# Patient Record
Sex: Female | Born: 1975
Health system: Southern US, Community
[De-identification: ages and names within clinical notes are randomized; demographics above are authoritative.]

## PROBLEM LIST (undated history)

## (undated) DIAGNOSIS — Z1231 Encounter for screening mammogram for malignant neoplasm of breast: Secondary | ICD-10-CM

## (undated) DIAGNOSIS — E559 Vitamin D deficiency, unspecified: Secondary | ICD-10-CM

## (undated) DIAGNOSIS — O26892 Other specified pregnancy related conditions, second trimester: Secondary | ICD-10-CM

## (undated) DIAGNOSIS — IMO0002 Reserved for concepts with insufficient information to code with codable children: Secondary | ICD-10-CM

## (undated) DIAGNOSIS — E079 Disorder of thyroid, unspecified: Secondary | ICD-10-CM

## (undated) DIAGNOSIS — N979 Female infertility, unspecified: Secondary | ICD-10-CM

## (undated) DIAGNOSIS — R87619 Unspecified abnormal cytological findings in specimens from cervix uteri: Secondary | ICD-10-CM

## (undated) DIAGNOSIS — E119 Type 2 diabetes mellitus without complications: Secondary | ICD-10-CM

## (undated) DIAGNOSIS — N200 Calculus of kidney: Secondary | ICD-10-CM

## (undated) DIAGNOSIS — N883 Incompetence of cervix uteri: Secondary | ICD-10-CM

## (undated) HISTORY — DX: Type 2 diabetes mellitus without complications: E11.9

## (undated) HISTORY — PX: WISDOM TOOTH EXTRACTION: SHX21

## (undated) HISTORY — DX: Calculus of kidney: N20.0

## (undated) HISTORY — DX: Unspecified abnormal cytological findings in specimens from cervix uteri: R87.619

## (undated) HISTORY — DX: Disorder of thyroid, unspecified: E07.9

## (undated) HISTORY — DX: Female infertility, unspecified: N97.9

## (undated) HISTORY — DX: Incompetence of cervix uteri: N88.3

## (undated) HISTORY — DX: Reserved for concepts with insufficient information to code with codable children: IMO0002

## (undated) HISTORY — PX: OTHER SURGICAL HISTORY: SHX169

---

## 2004-07-26 ENCOUNTER — Encounter: Admission: RE | Admit: 2004-07-26 | Discharge: 2004-07-26 | Payer: Self-pay | Admitting: Family Medicine

## 2004-10-16 ENCOUNTER — Other Ambulatory Visit: Admission: RE | Admit: 2004-10-16 | Discharge: 2004-10-16 | Payer: Self-pay | Admitting: Obstetrics and Gynecology

## 2005-06-12 ENCOUNTER — Emergency Department (HOSPITAL_COMMUNITY): Admission: EM | Admit: 2005-06-12 | Discharge: 2005-06-12 | Payer: Self-pay | Admitting: Emergency Medicine

## 2008-06-19 ENCOUNTER — Inpatient Hospital Stay (HOSPITAL_COMMUNITY): Admission: AD | Admit: 2008-06-19 | Discharge: 2008-06-19 | Payer: Self-pay | Admitting: Obstetrics & Gynecology

## 2008-09-13 ENCOUNTER — Encounter: Admission: RE | Admit: 2008-09-13 | Discharge: 2008-09-13 | Payer: Self-pay | Admitting: Emergency Medicine

## 2008-11-23 ENCOUNTER — Ambulatory Visit (HOSPITAL_COMMUNITY): Admission: RE | Admit: 2008-11-23 | Discharge: 2008-11-23 | Payer: Self-pay | Admitting: Specialist

## 2009-11-21 IMAGING — RF DG HYSTEROGRAM
7 series · 7 of 7 positions shown · IV contrast (omnipaque)
Comparison: none

CLINICAL DATA: Evaluate tubal patency. History of repeated
miscarriage.

HYSTEROSALPINGOGRAM
TECHNIQUE: Following cleansing of the cervix and vagina with
Betadine solution, a hysterosalpingogram was performed using a 5-
French hysterosalpingogram catheter and Omnipaque 300 contrast. The
patient tolerated the examination without difficulty.
Fluoroscopy time: 1.0 minutes.

[Series 1: run · 1 of 1 slices shown (1 of 7)]
[im 1/1]
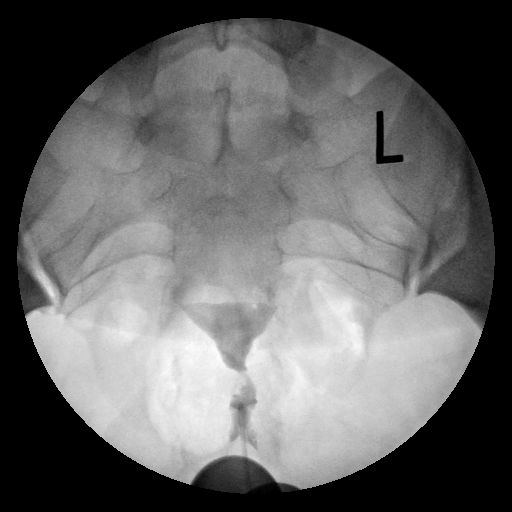

[Series 2: run · 1 of 1 slices shown (2 of 7)]
[im 1/1]
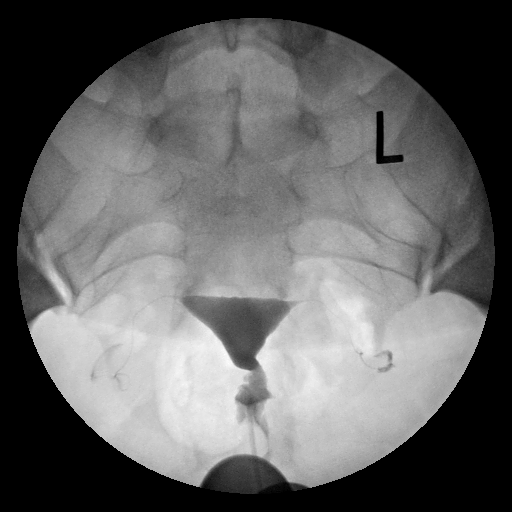

[Series 3: run · 1 of 1 slices shown (3 of 7)]
[im 1/1]
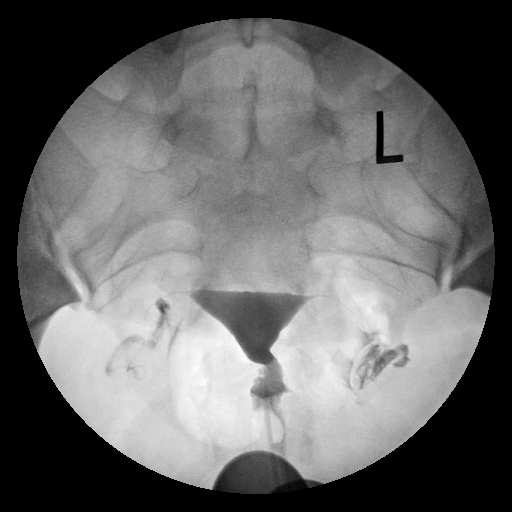

[Series 4: run · 1 of 1 slices shown (4 of 7)]
[im 1/1]
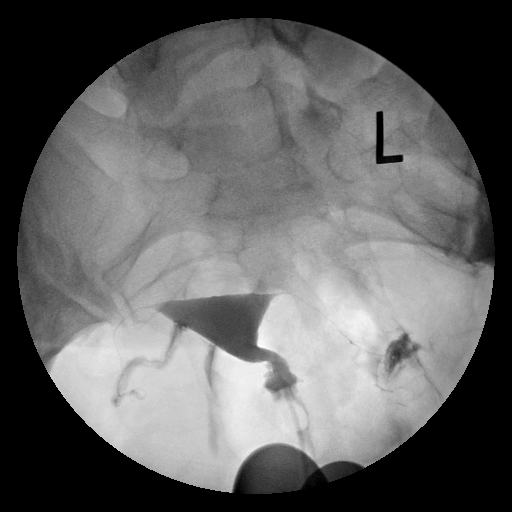

[Series 5: run · 1 of 1 slices shown (5 of 7)]
[im 1/1]
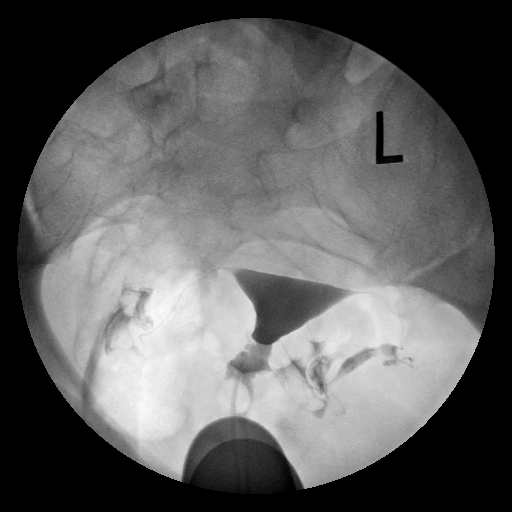

[Series 6: run · 1 of 1 slices shown (6 of 7)]
[im 1/1]
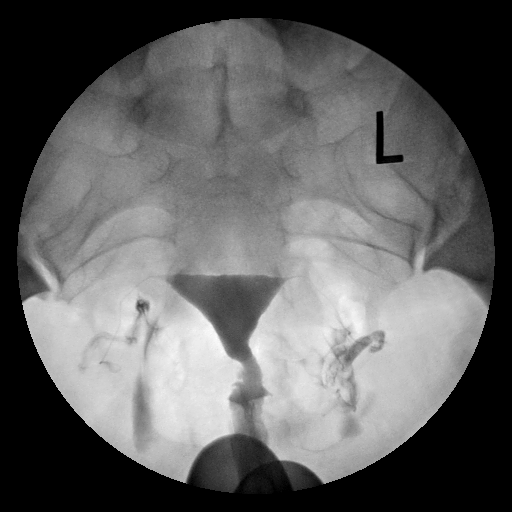

[Series 7: run · 1 of 1 slices shown (7 of 7)]
[im 1/1]
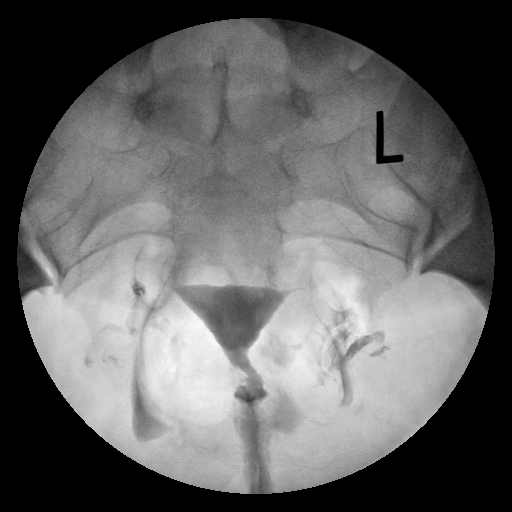

[7 of 7 positions shown; findings below may reference images not displayed]

FINDINGS: A normal endometrial morphology is seen.  Both fallopian
tubes have a normal morphology and bilateral free intraperitoneal
spill is noted.  No evidence for loculation of contrast is seen to
suggest the presence of peritubal or periovarian adhesions.
IMPRESSION: Normal HSG

## 2011-08-31 LAB — URINALYSIS, ROUTINE W REFLEX MICROSCOPIC
Bilirubin Urine: NEGATIVE
Glucose, UA: NEGATIVE
Ketones, ur: NEGATIVE
Leukocytes, UA: NEGATIVE
Nitrite: NEGATIVE
Protein, ur: NEGATIVE
Specific Gravity, Urine: 1.01
Urobilinogen, UA: 0.2
pH: 6

## 2011-08-31 LAB — POCT PREGNANCY, URINE
Operator id: 11741
Preg Test, Ur: POSITIVE

## 2011-08-31 LAB — ABO/RH: ABO/RH(D): B POS

## 2011-08-31 LAB — GC/CHLAMYDIA PROBE AMP, GENITAL
Chlamydia, DNA Probe: NEGATIVE
GC Probe Amp, Genital: NEGATIVE

## 2011-08-31 LAB — CBC
HCT: 39.6
Hemoglobin: 13
MCHC: 32.9
MCV: 80.3
Platelets: 244
RBC: 4.93
RDW: 14.7
WBC: 6.9

## 2011-08-31 LAB — HCG, QUANTITATIVE, PREGNANCY: hCG, Beta Chain, Quant, S: 1491 — ABNORMAL HIGH

## 2011-08-31 LAB — WET PREP, GENITAL
Clue Cells Wet Prep HPF POC: NONE SEEN
Trich, Wet Prep: NONE SEEN
Yeast Wet Prep HPF POC: NONE SEEN

## 2011-08-31 LAB — URINE MICROSCOPIC-ADD ON

## 2012-09-02 DIAGNOSIS — N200 Calculus of kidney: Secondary | ICD-10-CM

## 2012-09-02 HISTORY — DX: Calculus of kidney: N20.0

## 2012-09-22 ENCOUNTER — Ambulatory Visit (INDEPENDENT_AMBULATORY_CARE_PROVIDER_SITE_OTHER): Payer: BC Managed Care – PPO | Admitting: Obstetrics and Gynecology

## 2012-09-22 VITALS — BP 132/84 | HR 80 | Wt 253.0 lb

## 2012-09-22 DIAGNOSIS — N970 Female infertility associated with anovulation: Secondary | ICD-10-CM

## 2012-09-22 DIAGNOSIS — O99810 Abnormal glucose complicating pregnancy: Secondary | ICD-10-CM

## 2012-09-22 DIAGNOSIS — D497 Neoplasm of unspecified behavior of endocrine glands and other parts of nervous system: Secondary | ICD-10-CM

## 2012-09-22 DIAGNOSIS — E119 Type 2 diabetes mellitus without complications: Secondary | ICD-10-CM

## 2012-09-22 DIAGNOSIS — Z331 Pregnant state, incidental: Secondary | ICD-10-CM

## 2012-09-22 DIAGNOSIS — O24419 Gestational diabetes mellitus in pregnancy, unspecified control: Secondary | ICD-10-CM

## 2012-09-22 NOTE — Progress Notes (Signed)
Pt presented to office for NOB interview.  States LMP 08/14/12.  States was seen at [facility] 09/19/12 for bleeding and pain. Was diagnosed with possible kidney stone. Unable to see fetus on U/S. States was advised to have HCG done. Per DD, interview cancelled.  To have eval today.  S: This patient has presented today for evaluation of Quant for Pregnancy. She is a 36 y/o AA female that has had a positive pregnancy test last Thursday and on the Sunday prior. The Sunday testing was non-conclusive and repeated pregnancy test on last Thursday which was positive. On Friday 09/18/12 she attended [facility] with complaint of severe back pain. Quant was done = 89. USS of Uterus and Fallopian Tubes had not significant findings. Also r/o Kidney stones as differential diagnosis and none seen. O: Today patient states that she has slight back pain and breast tenderness.      Affect: AAO x 3      Lungs: CTAB      CV: RRR      Abdomen: soft B/S x 4      GU: Normal      GI: Normal      Extremities: Normal Hx of infertility 1st Pregnancy cerclage, Pre E and Labored for 3 days and eventually had PLTCS for failure to progress. Hx of SAB x 4 and TAB x 1 Last Pregnancy 2009 which resulted in SAB at 11 weeks Hx of Thyroid Tumor with no etiology The patient is is type 2 Diabetic - maintained on Metformin 500 mgs po  BID but has not been taking her meds for the past weeks as B/G has been very low. A: Possible pregnancy  P: Quant, Thyroid panel, HbA1c, Prolactin and Progesterone level today.     Patient is going on vacation to the [place] for 5 days on Wednesday so Quant will be stat and will call patient with result prior to her travel. Earl Gala, CNM.

## 2012-09-23 ENCOUNTER — Telehealth: Payer: Self-pay | Admitting: Obstetrics and Gynecology

## 2012-09-23 LAB — HEMOGLOBIN A1C
Hgb A1c MFr Bld: 6.5 % — ABNORMAL HIGH (ref <5.7)
Mean Plasma Glucose: 140 mg/dL — ABNORMAL HIGH (ref <117)

## 2012-09-23 LAB — THYROID PANEL WITH TSH
Free Thyroxine Index: 2.9 (ref 1.0–3.9)
T3 Uptake: 33.8 % (ref 22.5–37.0)
T4, Total: 8.5 ug/dL (ref 5.0–12.5)
TSH: 2.278 u[IU]/mL (ref 0.350–4.500)

## 2012-09-23 LAB — PROGESTERONE: Progesterone: 17.6 ng/mL

## 2012-09-23 LAB — HCG, QUANTITATIVE, PREGNANCY: hCG, Beta Chain, Quant, S: 378 m[IU]/mL

## 2012-09-23 LAB — PROLACTIN: Prolactin: 14.8 ng/mL

## 2012-09-23 NOTE — Telephone Encounter (Signed)
Spoke with pt rgd her hcg quant. Told pt per dee quant went up and if about [redacted] weeks pregnant . Made a app with the lab for 10/03/2012 @ 11:30 am. pts voice understanding. bt cma

## 2012-09-29 ENCOUNTER — Ambulatory Visit (INDEPENDENT_AMBULATORY_CARE_PROVIDER_SITE_OTHER): Payer: BC Managed Care – PPO | Admitting: Obstetrics and Gynecology

## 2012-09-29 ENCOUNTER — Encounter: Payer: Self-pay | Admitting: Obstetrics and Gynecology

## 2012-09-29 ENCOUNTER — Telehealth: Payer: Self-pay | Admitting: Obstetrics and Gynecology

## 2012-09-29 VITALS — BP 132/80 | HR 78 | Wt 250.0 lb

## 2012-09-29 DIAGNOSIS — O209 Hemorrhage in early pregnancy, unspecified: Secondary | ICD-10-CM

## 2012-09-29 LAB — POCT URINALYSIS DIPSTICK
Bilirubin, UA: NEGATIVE
Glucose, UA: NEGATIVE
Leukocytes, UA: NEGATIVE
Nitrite, UA: NEGATIVE
Protein, UA: NEGATIVE
Spec Grav, UA: >=1.03
Urobilinogen, UA: NEGATIVE
pH, UA: 5

## 2012-09-29 LAB — POCT URINE PREGNANCY: Preg Test, Ur: POSITIVE

## 2012-09-29 MED ORDER — AMBULATORY NON FORMULARY MEDICATION
25.0000 mg | Freq: Two times a day (BID) | Status: AC
Start: 2012-09-29 — End: ?

## 2012-09-29 NOTE — Telephone Encounter (Signed)
Pt called, states is coming in from out of town but she has been having spotting all weekend.  May see the spotting with wiping or it may be seen when wearing a pad.  Pt denies any pain and denies any intercourse.  Pt requesting appt to be seen today.  Pt scheduled a work in appt w EP today @ 1400 for eval, pt voices agreement.

## 2012-09-29 NOTE — Progress Notes (Deleted)
36 YO who is [redacted] weeks pregnant by dates complains of vaginal spotting since 3 days ago and has not increased in volume.  Had cramps on first day rated as a 6/10 on a 10 point pain scale but did not require analgesia..  Patient was seen at [facility] ER 10/18 for bleeding and pain with a QHCG=89.  Was diagnosed with a renal stone. Then seen on 09/2112 at [facility] with a QHCG=378, HgbA1C elevated at 6.5 and a normal thyroid panel.  Denies any urinary, bowel or vaginitis symptoms.

## 2012-09-29 NOTE — Progress Notes (Signed)
36 YO who is [redacted] weeks pregnant by dates complains of vaginal spotting since 3 days ago and has not increased in volume.  Had cramps on first day rated as a 6/10 on a 10 point pain scale but did not require analgesia..  Patient was seen at [facility] ER 10/18 for bleeding and pain with a QHCG=89.  Was diagnosed with a renal stone. Then seen on 09/2112 at [facility] with a QHCG=378, HgbA1C elevated at 6.5 and a normal thyroid panel.  Denies any urinary, bowel or vaginitis symptoms.  Patient understands that her progesterone at last visit was normal at 17 however, she would like to supplement.  Advised that there is no data to show that it is beneficial but should not be harmful.   O: Abdomen: soft, non-tender      Pelvic:  EGBUS-wnl, vagina-brown, cervix-long/closed, uterus-ULNS without tenderness, adnexae-no masses or tenderness  A:  Bleeding in Early Pregnancy      B+ Blood     H/O Multiple SAB  P; QHCG-pending;  if > 2500 will do ultrasound otherwise will repeat 10/01/12      Progesterone (patient request), GC/CT-pending          Progesterone Suppositories 25 mg # 60 1 pv twice daily 2 refills      RTO-TBA   Kao Berkheimer, PA-C

## 2012-09-30 ENCOUNTER — Other Ambulatory Visit: Payer: Self-pay | Admitting: Obstetrics and Gynecology

## 2012-09-30 ENCOUNTER — Encounter: Payer: Self-pay | Admitting: Obstetrics and Gynecology

## 2012-09-30 ENCOUNTER — Telehealth: Payer: Self-pay

## 2012-09-30 ENCOUNTER — Other Ambulatory Visit: Payer: Self-pay

## 2012-09-30 ENCOUNTER — Ambulatory Visit (INDEPENDENT_AMBULATORY_CARE_PROVIDER_SITE_OTHER): Payer: BC Managed Care – PPO

## 2012-09-30 ENCOUNTER — Ambulatory Visit (INDEPENDENT_AMBULATORY_CARE_PROVIDER_SITE_OTHER): Payer: BC Managed Care – PPO | Admitting: Obstetrics and Gynecology

## 2012-09-30 ENCOUNTER — Encounter: Payer: BC Managed Care – PPO | Admitting: Obstetrics and Gynecology

## 2012-09-30 VITALS — BP 124/80 | Wt 253.0 lb

## 2012-09-30 DIAGNOSIS — O209 Hemorrhage in early pregnancy, unspecified: Secondary | ICD-10-CM

## 2012-09-30 LAB — PROGESTERONE: Progesterone: 17.8 ng/mL

## 2012-09-30 LAB — US OB TRANSVAGINAL

## 2012-09-30 LAB — US OB COMP LESS 14 WKS

## 2012-09-30 LAB — HCG, QUANTITATIVE, PREGNANCY: hCG, Beta Chain, Quant, S: 8522.1 m[IU]/mL

## 2012-09-30 NOTE — Progress Notes (Signed)
36 YO with QHCG=8522 and vaginal spotting presents for ultrasound.  O: U/S= [redacted] week Gestational Sac, no yolk sac seen or fluid in the CDS, ovaries appeared normal  A: Bleeding in early pregnancy  P: Bleeding precautions      Repeat U/S in 10-12 days for viability      Patient urged to take her multivitamis      RTO-as scheduled or prn  Rohini Jaroszewski, PA-C

## 2012-09-30 NOTE — Telephone Encounter (Signed)
CALL PT TO SCHEDULE U/S. U/S SCHEDULED AT 3:30 PM TODAY.

## 2012-10-01 ENCOUNTER — Other Ambulatory Visit: Payer: Self-pay | Admitting: Obstetrics and Gynecology

## 2012-10-01 ENCOUNTER — Other Ambulatory Visit: Payer: Self-pay

## 2012-10-01 DIAGNOSIS — O209 Hemorrhage in early pregnancy, unspecified: Secondary | ICD-10-CM | POA: Insufficient documentation

## 2012-10-01 DIAGNOSIS — O2620 Pregnancy care for patient with recurrent pregnancy loss, unspecified trimester: Secondary | ICD-10-CM

## 2012-10-01 LAB — GC/CHLAMYDIA PROBE AMP
CT Probe RNA: NEGATIVE
GC Probe RNA: NEGATIVE

## 2012-10-01 NOTE — Addendum Note (Signed)
Addended byWinfred Leeds on: 10/01/2012 03:36 PM   Modules accepted: Orders

## 2012-10-03 ENCOUNTER — Other Ambulatory Visit: Payer: BC Managed Care – PPO

## 2012-10-15 ENCOUNTER — Encounter: Payer: Self-pay | Admitting: Obstetrics and Gynecology

## 2012-10-15 ENCOUNTER — Other Ambulatory Visit: Payer: Self-pay | Admitting: Obstetrics and Gynecology

## 2012-10-15 ENCOUNTER — Ambulatory Visit (INDEPENDENT_AMBULATORY_CARE_PROVIDER_SITE_OTHER): Payer: BC Managed Care – PPO | Admitting: Obstetrics and Gynecology

## 2012-10-15 ENCOUNTER — Ambulatory Visit (INDEPENDENT_AMBULATORY_CARE_PROVIDER_SITE_OTHER): Payer: BC Managed Care – PPO

## 2012-10-15 VITALS — BP 126/80 | HR 82 | Wt 251.0 lb

## 2012-10-15 DIAGNOSIS — O209 Hemorrhage in early pregnancy, unspecified: Secondary | ICD-10-CM

## 2012-10-15 DIAGNOSIS — O2620 Pregnancy care for patient with recurrent pregnancy loss, unspecified trimester: Secondary | ICD-10-CM

## 2012-10-15 DIAGNOSIS — Z3490 Encounter for supervision of normal pregnancy, unspecified, unspecified trimester: Secondary | ICD-10-CM

## 2012-10-15 LAB — US OB TRANSVAGINAL

## 2012-10-15 NOTE — Progress Notes (Signed)
36 YO with a history of cerclage at 22 weeks (though ended up with a C-section), seen previous for first trimester bleeding returns for viability ultrasound.  O:  U/S: 8 weeks 2 days FHR-149 bpm  A:  Viable Pregnancy  P:  NOB interview and exam       RTO-as scheduled or prn  Aala Ransom, PA-C

## 2014-10-04 ENCOUNTER — Encounter: Payer: Self-pay | Admitting: Obstetrics and Gynecology

## 2014-10-27 LAB — POC URINE MACROSCOPIC
Bilirubin: NEGATIVE
Glucose: 100 mg/dl — AB
Nitrites: NEGATIVE
Specific gravity: 1.02 (ref 1.005–1.030)
Urobilinogen: 4 EU/dl — ABNORMAL HIGH (ref 0.0–1.0)
pH (UA): 7 (ref 5–9)

## 2014-10-27 LAB — POC URINE MICROSCOPIC

## 2014-10-27 LAB — CT/GC DNA (PROBETEC)
CHLAMYDIA TRACHOMATIS DNA: NOT DETECTED
NEISSERIA GONORRHOEAE DNA: NOT DETECTED

## 2014-10-27 LAB — POC HCG,URINE: HCG urine, QL: NEGATIVE

## 2014-10-27 NOTE — ED Provider Notes (Addendum)
[facility]  EMERGENCY [facility] TREATMENT REPORT  NAME:  Allison Peterson, Allison Peterson  SEX:   F  ADMIT: 10/27/2014  DOB:   1976/08/03  MR#    [id number]  ROOM:    TIME DICTATED: 07 11 PM  ACCT#  [id number]    cc: Flo ShanksSteven Powers MD    PRIMARY CARE PHYSICIAN:  Dr. Lorenso CourierPowers    CHIEF COMPLAINT:  Displaced IUD.    HISTORY OF PRESENT ILLNESS:  This is a 38 year old female who [place]  She states she has been having issues with it  since then.  She has not been able to feel the strings of her IUD for the past  3 days and has had some generalized abdominal discomfort for the past 2 days.  She has also noticed thick white and pink vaginal discharge.  She states she  has a history of bacterial vaginosis and yeast infections.  Denies having any  urinary symptoms.    REVIEW OF SYSTEMS:  CONSTITUTIONAL:  No fever or chills.  GASTROINTESTINAL:  No vomiting or diarrhea.  GENITOURINARY:  No dysuria, frequency, or urgency.  MUSCULOSKELETAL:  No joint pain or swelling.  INTEGUMENTARY:  No rashes.    PAST MEDICAL HISTORY:  Diabetes type 2.    SOCIAL HISTORY:  No tobacco.    FAMILY HISTORY:  None.    ALLERGIES:  MULTIPLE AND REVIEWED IN IBEX.    MEDICATIONS:  Multiple and reviewed in Ibex.    PHYSICAL EXAMINATION:  VITAL SIGNS:  Blood pressure 135/82, pulse 87, respirations 16, temperature  98, oxygen 100% on room air.  GENERAL APPEARANCE:  Patient appears well developed and well nourished.  Appearance and behavior are age and situation appropriate.   RESPIRATORY:  Clear and equal breath sounds.  No respiratory distress,  tachypnea, or accessory muscle use.    CARDIOVASCULAR:   Heart regular, without murmurs, gallops, rubs, or thrills.       GASTROINTESTINAL:   Abdomen is soft and nontender.  GENITOURINARY:  Female external genitalia without swelling, lesions or  discharge.  Cervix is pink with frothy white and pink discharge in the vaginal   vault.  I cannot visualize or palpate IUD strings.  Uterus moveable without  mass or tenderness.  No adnexal masses or tenderness.    INITIAL ASSESSMENT AND MANAGEMENT PLAN:      This is a 38 year old female with vaginal discharge, possible displacement of  IUD.  We will obtain a KUB, urine, urine pregnancy and pelvic cultures.    DIAGNOSTIC STUDIES:  KUB was read as an IUD projecting over the pelvis.  Wet prep positive for clue  cells.  Urine unremarkable.    COURSE:  The patient remained stable throughout her ED stay.  The patient was made  aware of the diagnostic test results.  I have asked her to follow up with  OB/GYN for further evaluation of her IUD.  She can have an outpatient  transvaginal ultrasound done if needed, but she is interested in having her  IUD removed.  She was written a prescription for Flagyl.    FINAL DIAGNOSES:  1.  Bacterial vaginosis.    2.  Intrauterine device evaluation.    DISPOSITION:  The patient is discharged home in stable condition.    The patient is discharged home in stable condition, with instructions to  follow up with their regular doctor.  They are advised to return immediately  for any worsening or symptoms  of concern.     The patient was personally evaluated by myself and Dr. Carolin GuernseyJennifer Himmel-Ritesh Opara,  who agrees with the above assessment and plan.      ___________________  Liberty HandyJennifer H Himmel-Shefali Ng DO  Dictated By: Caprice RenshawJenny Lynn E. Mertie MooresWhittington, PA-C    My signature above authenticates this document and my orders, the final  diagnosis (es), discharge prescription (s), and instructions in the [facility]  Pul[facility]ck record.  Nursing notes have been reviewed by the physician/mid-level provider.    If you have any questions please contact [phone].    [facility]  D:10/27/2014 19:11:56  T: 10/27/2014 20:00:37  [id number]  Electronically Authenticated by:  Carolin GuernseyJENNIFER HIMMEL-Devarius Nelles, DO On 10/29/2014 02:08 AM EST

## 2015-11-30 ENCOUNTER — Inpatient Hospital Stay
Admit: 2015-11-30 | Discharge: 2015-11-30 | Disposition: A | Discharge status: Home / Self Care | Payer: MEDICAID | Attending: Emergency Medicine

## 2015-11-30 ENCOUNTER — Emergency Department: Admit: 2015-11-30 | Payer: MEDICAID | Primary: Internal Medicine

## 2015-11-30 ENCOUNTER — Inpatient Hospital Stay: Payer: MEDICAID

## 2015-11-30 DIAGNOSIS — O9A212 Injury, poisoning and certain other consequences of external causes complicating pregnancy, second trimester: Secondary | ICD-10-CM

## 2015-11-30 LAB — POC URINE MACROSCOPIC
Bilirubin: NEGATIVE
Glucose: 250 mg/dl — AB
Ketone: 15 mg/dl — AB
Leukocyte Esterase: NEGATIVE
Nitrites: NEGATIVE
Specific gravity: >=1.03 (ref 1.005–1.030)
Urobilinogen: 0.2 EU/dl (ref 0.0–1.0)
pH (UA): 5.5 (ref 5–9)

## 2015-11-30 NOTE — ED Notes (Signed)
10:05 AM  11/30/15     Discharge instructions given to patient (name) with verbalization of understanding. Patient accompanied by boyfriend.  Patient discharged with the following prescriptions (none).  Patient discharged to home and ambulated off unit without incident   Teofilo PodMegan Kinlaw, RN

## 2015-11-30 NOTE — Progress Notes (Signed)
No UC's noted on toco, FHR checks WNL, pt sent to ED to have right sided pain evaluated.
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
## 2016-04-16 ENCOUNTER — Encounter

## 2016-04-17 ENCOUNTER — Encounter

## 2016-04-18 ENCOUNTER — Ambulatory Visit: Payer: MEDICAID | Primary: Internal Medicine

## 2017-01-30 ENCOUNTER — Other Ambulatory Visit: Primary: Internal Medicine

## 2017-01-30 ENCOUNTER — Ambulatory Visit: Attending: Internal Medicine | Primary: Internal Medicine

## 2017-01-30 DIAGNOSIS — F332 Major depressive disorder, recurrent severe without psychotic features: Secondary | ICD-10-CM

## 2017-01-30 MED ORDER — BUPROPION SR 100 MG TAB
100 mg | ORAL_TABLET | Freq: Two times a day (BID) | ORAL | 0 refills | Status: DC
Start: 2017-01-30 — End: 2017-02-27

## 2017-01-30 NOTE — Patient Instructions (Addendum)
FOR ANXIETY AND DEPRESSION:    START EATING THREE MEALS A DAY STARTING WITH BREAKFAST  CUT BACK ON COFFEE TO 1 CUP PER DAY WHILE ON MEDICINE  START DRINKING 2 LITERS OF WATER PER DAY (65 OUNCES)  EXERCISE 30 MINUTES A DAY WALKING IN MORNING  FOR MOOD START TAKING WELBUTRIN 100 MG SR TWICE DAILY, LAST DOSE 2PM, IF NO HELP AFTER 1 WEEK WITH ENERGY/FOCUS/MOTIVATION INCREASE TO 200 MG SR TWICE A DAY AND CALL CLINIC TO DISCUSS  START THERAPY AGAIN AT LEAST 2 X PER MONTH, LET us KNOW IF YOU NEED A REFERRAL  PICK UP BOOK "FEELING GOOD" BY DR. BURNS AS INTRODUCTION TO COGNITIVE BEHAVIORAL THERAPY AND START RE-READING    RETURN IN 3 WEEKS FOR FOLLOW UP     We will let you know the results of your blood and urine test when they come in. We will call if abnormal and send a letter if normal.         Recovering From Depression: Care Instructions  Your Care Instructions    Taking good care of yourself is important as you recover from depression. In time, your symptoms will fade as your treatment takes hold. Do not give up. Instead, focus your energy on getting better.  Your mood will improve. It just takes some time. Focus on things that can help you feel better, such as being with friends and family, eating well, and getting enough rest. But take things slowly. Do not do too much too soon. You will begin to feel better gradually.  Follow-up care is a key part of your treatment and safety. Be sure to make and go to all appointments, and call your doctor if you are having problems. It's also a good idea to know your test results and keep a list of the medicines you take.  How can you care for yourself at home?  Be realistic  ?? If you have a large task to do, break it up into smaller steps you can handle, and just do what you can.  ?? You may want to put off important decisions until your depression has lifted. If you have plans that will have a major impact on your life, such  as marriage, divorce, or a job change, try to wait a bit. Talk it over with friends and loved ones who can help you look at the overall picture first.  ?? Reaching out to people for help is important. Do not isolate yourself. Let your family and friends help you. Find someone you can trust and confide in, and talk to that person.  ?? Be patient, and be kind to yourself. Remember that depression is not your fault and is not something you can overcome with willpower alone. Treatment is necessary for depression, just like for any other illness. Feeling better takes time, and your mood will improve little by little.  Stay active  ?? Stay busy and get outside. Take a walk, or try some other light exercise.  ?? Talk with your doctor about an exercise program. Exercise can help with mild depression.  ?? Go to a movie or concert. Take part in a church activity or other social gathering. Go to a ball game.  ?? Ask a friend to have dinner with you.  Take care of yourself  ?? Eat a balanced diet with plenty of fresh fruits and vegetables, whole grains, and lean protein. If you have lost your appetite, eat small snacks rather than large meals.  ??  Avoid drinking alcohol or using illegal drugs. Do not take medicines that have not been prescribed for you. They may interfere with medicines you may be taking for depression, or they may make your depression worse.  ?? Take your medicines exactly as they are prescribed. You may start to feel better within 1 to 3 weeks of taking antidepressant medicine. But it can take as many as 6 to 8 weeks to see more improvement. If you have questions or concerns about your medicines, or if you do not notice any improvement by 3 weeks, talk to your doctor.  ?? If you have any side effects from your medicine, tell your doctor. Antidepressants can make you feel tired, dizzy, or nervous. Some people have dry mouth, constipation, headaches, sexual problems, or diarrhea.  Many of these side effects are mild and will go away on their own after you have been taking the medicine for a few weeks. Some may last longer. Talk to your doctor if side effects are bothering you too much. You might be able to try a different medicine.  ?? Get enough sleep. If you have problems sleeping:  ?? Go to bed at the same time every night, and get up at the same time every morning.  ?? Keep your bedroom dark and quiet.  ?? Do not exercise after 5:00 p.m.  ?? Avoid drinks with caffeine after 5:00 p.m.  ?? Avoid sleeping pills unless they are prescribed by the doctor treating your depression. Sleeping pills may make you groggy during the day, and they may interact with other medicine you are taking.  ?? If you have any other illnesses, such as diabetes, heart disease, or high blood pressure, make sure to continue with your treatment. Tell your doctor about all of the medicines you take, including those with or without a prescription.  ?? Keep the numbers for these national suicide hotlines: 1-800-273-TALK 709 753 5495(1-910-277-9855) and 1-800-SUICIDE 920-166-1159(1-(254)170-3874). If you or someone you know talks about suicide or feeling hopeless, get help right away.  When should you call for help?  Call 911 anytime you think you may need emergency care. For example, call if:  ? ?? You feel like hurting yourself or someone else.   ? ?? Someone you know has depression and is about to attempt or is attempting suicide.   ?Call your doctor now or seek immediate medical care if:  ? ?? You hear voices.   ? ?? Someone you know has depression and:  ?? Starts to give away his or her possessions.  ?? Uses illegal drugs or drinks alcohol heavily.  ?? Talks or writes about death, including writing suicide notes or talking about guns, knives, or pills.  ?? Starts to spend a lot of time alone.  ?? Acts very aggressively or suddenly appears calm.   ?Watch closely for changes in your health, and be sure to contact your doctor if:   ? ?? You do not get better as expected.   Where can you learn more?  Go to [employer]Stats[employer]cahttp://www.healthwise.net/[employer]Help[employer]ctions.  Enter [phone] in the search box to learn more about "Recovering From Depression: Care Instructions."  Current as of: Apr 13, 2016  Content Version: 11.4  ?? 2006-2017 [employer]. Care instructions adapted under license by [employer] (which disclaims liability or warranty for this information). If you have questions about a medical condition or this instruction, always ask your healthcare professional. [employer]wise[employer] Inc[employer] disclaims any warranty or liability for your use of this information.  Bupropion (  By mouth)   Bupropion (bue-PROE-pee-on)  Treats depression and aids in quitting smoking. Also prevents depression caused by seasonal affective disorder (SAD).   Brand Name(s): Aplenzin, Forfivo XL, Wellbutrin, Wellbutrin SR, Wellbutrin XL, Zyban   There may be other brand names for this medicine.  When This Medicine Should Not Be Used:   This medicine is not right for everyone. Do not use it if you had an allergic reaction to bupropion, or if you have seizures, anorexia, or bulimia.  How to Use This Medicine:   Tablet, Long Acting Tablet  ?? Take your medicine as directed. Your dose may need to be changed several times to find what works best for you.  ?? You may need to take Wellbutrin?? for up to 4 weeks before you feel better. You may need to take Zyban?? for 1 to 2 weeks before the date that you plan to stop smoking.  ?? Swallow the extended-release tablet whole. Do not crush, break, or chew it.  ?? It is best to take Aplenzin?? in the morning.  ?? Do not take Wellbutrin?? or Zyban?? close to bedtime if you have trouble sleeping.  ?? Take it with food if it upsets your stomach or if you have nausea.  ?? If you take the extended-release tablet, part of the tablet may pass into your stools. This is normal and is nothing to worry about.   ?? This medicine should come with a Medication Guide. Ask your pharmacist for a copy if you do not have one.  ?? Missed dose: Skip the missed dose and go back to your regular dosing schedule. Never take extra medicine to make up for a missed dose.  ?? Store the medicine in a closed container at room temperature, away from heat, moisture, and direct light.  Drugs and Foods to Avoid:   Ask your doctor or pharmacist before using any other medicine, including over-the-counter medicines, vitamins, and herbal products.  ?? Do not use this medicine and an MAO inhibitor (MAOI) within 14 days of each other. Do not use Zyban?? to quit smoking if you already take Aplenzin?? or Wellbutrin?? for depression, because they are the same medicine.  ?? Tell your doctor if you take barbiturates, benzodiazepines, antiseizure medicine, or sedatives, or if you recently stopped taking them.  ?? Some medicines can affect how bupropion works. Tell your doctor if you use any of the following:   ?? Amantadine, carbamazepine, cimetidine, clopidogrel, cyclophosphamide, digoxin, efavirenz, levodopa, lopinavir, nelfinavir, nicotine patch, orphenadrine, phenobarbital, phenytoin, ritonavir, tamoxifen, theophylline, thiotepa, ticlopidine  ?? Beta blocker medicine (including metoprolol)  ?? A blood thinner (including warfarin)  ?? Insulin or diabetes medicine  ?? Medicine to treat depression (including desipramine, fluoxetine, imipramine, nortriptyline, paroxetine, sertraline, venlafaxine)  ?? Medicine to treat heart rhythm problems (including flecainide, propafenone)  ?? Medicine to treat mental illness (including haloperidol, risperidone, thioridazine)  ?? Steroid medicine (including dexamethasone, hydrocortisone, methylprednisolone, prednisolone, prednisone)  ?? Do not drink alcohol while you are using this medicine.  ?? Tell your doctor if you use anything else that makes you sleepy. Some examples are allergy medicine, narcotic pain medicine, and alcohol.   Warnings While Using This Medicine:   ?? Tell your doctor if you are pregnant or breastfeeding, or if you have kidney disease, liver disease, heart disease, diabetes, glaucoma, mental illness (including bipolar disorder), or high blood pressure. Tell your doctor if you have a history of drug addiction or if you drink alcohol.  ?? For some children, teenagers,  and young adults, this medicine may increase mental or emotional problems. This may lead to thoughts of suicide and violence. Talk with your doctor right away if you have any thoughts or behavior changes that concern you. Tell your doctor if you or anyone in your family has a history of bipolar disorder or suicide attempts.  ?? This medicine may cause the following problems:  ?? Increased risk of seizures  ?? Changes in mood or behavior  ?? High blood pressure  ?? Serious skin reactions  ?? This medicine may make you dizzy or drowsy. Do not drive or do anything that could be dangerous until you know how this medicine affects you.  ?? Zyban?? is only part of a complete program to help you quit smoking. You may still want to smoke at times. Have a plan to cope with these situations.  ?? Do not stop using this medicine suddenly. Your doctor will need to slowly decrease your dose before you stop it completely.  ?? Tell any doctor or dentist who treats you that you are using this medicine. This medicine may affect certain medical test results.  ?? Your doctor will check your progress and the effects of this medicine at regular visits. Keep all appointments.  ?? Keep all medicine out of the reach of children. Never share your medicine with anyone.  Possible Side Effects While Using This Medicine:   Call your doctor right away if you notice any of these side effects:  ?? Allergic reaction: Itching or hives, swelling in your face or hands, swelling or tingling in your mouth or throat, chest tightness, trouble breathing  ?? Blistering, peeling, red skin rash   ?? Chest pain, trouble breathing, fast, slow, or uneven heartbeat  ?? Eye pain, vision changes, seeing halos around lights  ?? Muscle or joint pain, fever with rash  ?? Seeing or hearing things that are not there, feeling like people are against you  ?? Seizures  ?? Sudden increase in energy, racing thoughts, trouble sleeping  ?? Thoughts of hurting yourself, worsening depression, severe agitation or confusion  If you notice these less serious side effects, talk with your doctor:   ?? Dry mouth  ?? Headache, dizziness  ?? Nausea, vomiting, constipation, diarrhea, gas, stomach pain  ?? Weight gain or loss  If you notice other side effects that you think are caused by this medicine, tell your doctor.   Call your doctor for medical advice about side effects. You may report side effects to FDA at 1-800-FDA-1088  ?? 2017 [facility] Information is for End User's use only and may not be sold, redistributed or otherwise used for commercial purposes.  The above information is an educational aid only. It is not intended as medical advice for individual conditions or treatments. Talk to your doctor, nurse or pharmacist before following any medical regimen to see if it is safe and effective for you.

## 2017-01-30 NOTE — Progress Notes (Signed)
Chief Complaint   Patient presents with   ??? Establish Care   ??? Depression     1. Have you been to the ER, urgent care clinic since your last visit?  Hospitalized since your last visit?Yes Where: [facility] for headaches    2. Have you seen or consulted any other health care providers [facility] since your last visit?  Include any pap smears or colon screening. Yes Where: Dr.Eileen Gilmen pcp and [facility]
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
## 2017-01-31 ENCOUNTER — Telehealth

## 2017-01-31 LAB — VITAMIN D, 25 HYDROXY: VITAMIN D, 25-HYDROXY: 8.5 ng/mL — ABNORMAL LOW (ref 30.0–100.0)

## 2017-01-31 LAB — CBC WITH AUTOMATED DIFF

## 2017-01-31 LAB — VITAMIN B12: Vitamin B12: 559 pg/mL (ref 232–1245)

## 2017-01-31 MED ORDER — ERGOCALCIFEROL (VITAMIN D2) 50,000 UNIT CAP
1250 mcg (50,000 unit) | ORAL_CAPSULE | ORAL | 5 refills | Status: AC
Start: 2017-01-31 — End: 2017-07-30

## 2017-01-31 NOTE — Telephone Encounter (Signed)
LPN please call patient and inform of lab results Significantly low vitamin d. Needs to return for repeat CBC given blood clotted.     Vitamin D sent to pharmacy on file, will need to take weekly for 6 months, will recheck at that time.     Adrian SaranAleea Maye M.D.  [facility]  [address] [place], [address]  [place], [place] [id number]  Ph - [phone]  Fax - [phone]

## 2017-02-04 NOTE — Telephone Encounter (Signed)
Lmom to informed patient that vit d was sent over to her pharmacy and to please give me a call back to discuss other labs. Name and number given.

## 2017-02-20 ENCOUNTER — Encounter: Attending: Internal Medicine | Primary: Internal Medicine

## 2017-02-27 ENCOUNTER — Ambulatory Visit: Admit: 2017-02-27 | Discharge: 2017-02-27 | Payer: MEDICARE | Attending: Internal Medicine | Primary: Internal Medicine

## 2017-02-27 DIAGNOSIS — L03115 Cellulitis of right lower limb: Secondary | ICD-10-CM

## 2017-02-27 MED ORDER — CEPHALEXIN 500 MG CAP
500 mg | ORAL_CAPSULE | Freq: Four times a day (QID) | ORAL | 0 refills | Status: AC
Start: 2017-02-27 — End: 2017-03-06

## 2017-02-27 MED ORDER — BUPROPION XL 150 MG 24 HR TAB
150 mg | ORAL_TABLET | ORAL | 2 refills | Status: AC
Start: 2017-02-27 — End: 2017-05-28

## 2017-02-27 NOTE — Patient Instructions (Signed)
For Leg:  Start taking keflex 500 mg four times a day for 7 days  Return in 1-2 weeks if leg still as painful, is more warm and reddish compared to other leg    For Depression:  Take wellbutrin XL 150 mg in morning 7AM   Continue working on exercise 30 minutes walking 4-5 days a week  Continue with eating three meals a day  Try to get back into therapy when schedule permits  Return in 8 weeks follow up         Recovering From Depression: Care Instructions  Your Care Instructions    Taking good care of yourself is important as you recover from depression. In time, your symptoms will fade as your treatment takes hold. Do not give up. Instead, focus your energy on getting better.  Your mood will improve. It just takes some time. Focus on things that can help you feel better, such as being with friends and family, eating well, and getting enough rest. But take things slowly. Do not do too much too soon. You will begin to feel better gradually.  Follow-up care is a key part of your treatment and safety. Be sure to make and go to all appointments, and call your doctor if you are having problems. It's also a good idea to know your test results and keep a list of the medicines you take.  How can you care for yourself at home?  Be realistic  ?? If you have a large task to do, break it up into smaller steps you can handle, and just do what you can.  ?? You may want to put off important decisions until your depression has lifted. If you have plans that will have a major impact on your life, such as marriage, divorce, or a job change, try to wait a bit. Talk it over with friends and loved ones who can help you look at the overall picture first.  ?? Reaching out to people for help is important. Do not isolate yourself. Let your family and friends help you. Find someone you can trust and confide in, and talk to that person.  ?? Be patient, and be kind to yourself. Remember that depression is not  your fault and is not something you can overcome with willpower alone. Treatment is necessary for depression, just like for any other illness. Feeling better takes time, and your mood will improve little by little.  Stay active  ?? Stay busy and get outside. Take a walk, or try some other light exercise.  ?? Talk with your doctor about an exercise program. Exercise can help with mild depression.  ?? Go to a movie or concert. Take part in a church activity or other social gathering. Go to a ball game.  ?? Ask a friend to have dinner with you.  Take care of yourself  ?? Eat a balanced diet with plenty of fresh fruits and vegetables, whole grains, and lean protein. If you have lost your appetite, eat small snacks rather than large meals.  ?? Avoid drinking alcohol or using illegal drugs. Do not take medicines that have not been prescribed for you. They may interfere with medicines you may be taking for depression, or they may make your depression worse.  ?? Take your medicines exactly as they are prescribed. You may start to feel better within 1 to 3 weeks of taking antidepressant medicine. But it can take as many as 6 to 8 weeks to see more  improvement. If you have questions or concerns about your medicines, or if you do not notice any improvement by 3 weeks, talk to your doctor.  ?? If you have any side effects from your medicine, tell your doctor. Antidepressants can make you feel tired, dizzy, or nervous. Some people have dry mouth, constipation, headaches, sexual problems, or diarrhea. Many of these side effects are mild and will go away on their own after you have been taking the medicine for a few weeks. Some may last longer. Talk to your doctor if side effects are bothering you too much. You might be able to try a different medicine.  ?? Get enough sleep. If you have problems sleeping:  ?? Go to bed at the same time every night, and get up at the same time every morning.  ?? Keep your bedroom dark and quiet.   ?? Do not exercise after 5:00 p.m.  ?? Avoid drinks with caffeine after 5:00 p.m.  ?? Avoid sleeping pills unless they are prescribed by the doctor treating your depression. Sleeping pills may make you groggy during the day, and they may interact with other medicine you are taking.  ?? If you have any other illnesses, such as diabetes, heart disease, or high blood pressure, make sure to continue with your treatment. Tell your doctor about all of the medicines you take, including those with or without a prescription.  ?? Keep the numbers for these national suicide hotlines: 1-800-273-TALK 564-801-1530) and 1-800-SUICIDE (570)174-2534). If you or someone you know talks about suicide or feeling hopeless, get help right away.  When should you call for help?  Call 911 anytime you think you may need emergency care. For example, call if:  ? ?? You feel like hurting yourself or someone else.   ? ?? Someone you know has depression and is about to attempt or is attempting suicide.   ?Call your doctor now or seek immediate medical care if:  ? ?? You hear voices.   ? ?? Someone you know has depression and:  ?? Starts to give away his or her possessions.  ?? Uses illegal drugs or drinks alcohol heavily.  ?? Talks or writes about death, including writing suicide notes or talking about guns, knives, or pills.  ?? Starts to spend a lot of time alone.  ?? Acts very aggressively or suddenly appears calm.   ?Watch closely for changes in your health, and be sure to contact your doctor if:  ? ?? You do not get better as expected.   Where can you learn more?  Go to [employer].  Enter [phone] in the search box to learn more about "Recovering From Depression: Care Instructions."  Current as of: Apr 13, 2016  Content Version: 11.4  ?? 2006-2017 [employer]. Care instructions adapted under license by [employer] (which disclaims liability or warranty  for this information). If you have questions about a medical condition or this instruction, always ask your healthcare professional. [employer] disclaims any warranty or liability for your use of this information.

## 2017-02-27 NOTE — Telephone Encounter (Signed)
Dr.Eileen Iran PlanasGilman ([facility]) office is closed. Will call back and request notes tomorrow. Patient was last seen about 1 to 2 months ago. [phone].

## 2017-02-27 NOTE — Progress Notes (Signed)
Internal Medicine Follow Up Visit Note    Chief Complaint   Patient presents with   ??? Depression   ??? Diabetes       HPI:  Allison Peterson is a 41 y.o. African American female with history significant for Diabetes, Depression is here for the above complaint(s).     Depression: Since last visit reports sx are better. Started taking wellbutrin SR 100 mg 9 or 10AM and working well, some trouble falling asleep at times. Taking 100 mg twice a day, no later than 2 PM. Hasn't seen therpist recently. Work schedule changed. Eaitng 3 meals a day. Exercise minimal. Trying to increase walking.     Diabetes: managed by endocrinology, seen last 1-2 months ago.     Cut on leg in November 2017. Wasn't healing by January, still open, obtained antibiotic to take, no itching, closed up February 2018.     ROS: as per HPI.    Current Outpatient Prescriptions   Medication Sig   ??? cephALEXin (KEFLEX) 500 mg capsule Take 1 Cap by mouth four (4) times daily for 7 days.   ??? buPROPion XL (WELLBUTRIN XL) 150 mg tablet Take 1 Tab by mouth every morning for 90 days.   ??? ergocalciferol (ERGOCALCIFEROL) 50,000 unit capsule Take 1 Cap by mouth every seven (7) days for 180 days.   ??? IP [facility] INSULIN GLULISINE, APIDRA, FOR PUMP Indications: Vial for pump   ??? furosemide (LASIX) 20 mg tablet Take  by mouth daily.   ??? metFORMIN (GLUCOPHAGE) 500 mg tablet Take  by mouth two (2) times daily (with meals). Indications: metformin er   ??? PRENATAL VIT37/IRON/FOLIC ACID (PRENATA PO) Take  by mouth.   ??? CALCIUM PO Take  by mouth two (2) times a day.   ??? prenatal multivit-ca-min-fe-fa tab Take  by mouth.   ??? CONTOUR NEXT STRIPS strip    ??? APIDRA U-100 INSULIN 100 unit/mL injection    ??? metFORMIN ER (GLUCOPHAGE XR) 500 mg tablet    ??? topiramate (TOPAMAX) 25 mg tablet    ??? insulin detemir (LEVEMIR FLEXPEN) 100 unit/mL (3 mL) inpn 20 Units by SubCUTAneous route two (2) times a day.   ??? insulin glulisine (APIDRA SOLOSTAR) 100 unit/mL pen 14 Units by  SubCUTAneous route three (3) times daily (with meals). Indications: type 2 diabetes mellitus     No current facility-administered medications for this visit.      Health Maintenance   Topic Date Due   ??? HEMOGLOBIN A1C Q6M  11-05-1974   ??? LIPID PANEL Q1  Apr 11, 1974   ??? MICROALBUMIN Q1  04/05/1984   ??? EYE EXAM RETINAL OR DILATED Q1  04/05/1984   ??? Pneumococcal 19-64 Medium Risk (1 of 1 - PPSV23) 04/05/1993   ??? MEDICARE YEARLY EXAM  02/27/2017   ??? FOOT EXAM Q1  12/03/2017   ??? PAP AKA CERVICAL CYTOLOGY  04/07/2019   ??? DTaP/Tdap/Td series (2 - Td) 12/03/2025   ??? Influenza Age 6 to Adult  Completed       There is no immunization history on file for this patient.    Allergies and Medications: Reviewed and updated in EMR.     Patient Active Problem List   Diagnosis Code   ??? Asthma J45.909   ??? Type 2 diabetes mellitus with diabetic neuropathy, with long-term current use of insulin (HCC) E11.40, Z79.4   ??? Obesity E66.9   ??? Thyroid adenoma D34   ??? Health care maintenance Z00.00   ??? MDD (major  depressive disorder), recurrent severe, without psychosis (HCC) F33.2   ??? Anxiety F41.9   ??? Vitamin D deficiency E55.9   ??? Cellulitis of right lower extremity L03.115     Family History, Social History, Past Medical History, Surgical History: Reviewed and updated in EMR as appropriate.      OBJECTIVE:   Visit Vitals   ??? BP 131/77   ??? Pulse 97   ??? Temp 98.5 ??F (36.9 ??C) (Oral)   ??? Resp 18   ??? Ht 5\' 11"  (1.803 m)   ??? Wt 265 lb (120.2 kg)   ??? LMP 02/04/2017   ??? SpO2 98%  Comment: RA   ??? BMI 36.96 kg/m2        BP Readings from Last 3 Encounters:   02/27/17 131/77   01/30/17 132/85   11/30/15 136/82     Wt Readings from Last 3 Encounters:   02/27/17 265 lb (120.2 kg)   01/30/17 265 lb (120.2 kg)   11/30/15 283 lb (128.4 kg)       General: Pleasant adult woman in no acute distress  HEENT: Head is atraumatic normo-cephalic.   MSE: mood euthymic, affect congruent and reactive. No SI/HI.    Skin: Right leg with healing ulcerations, darkened, warm, mildly erythematous. Pulses 2+ DP.     PHQ-9: 7, somewhat difficult  GAD-7: 5 somewhat difficult     Nursing Notes Reviewed.      ASSESSMENT/PLAN:      ICD-10-CM ICD-9-CM    1. Cellulitis of right lower extremity L03.115 682.6 cephALEXin (KEFLEX) 500 mg capsule  Given return precautions   2. MDD (major depressive disorder), recurrent episode, mild (HCC) F33.0 296.31 buPROPion XL (WELLBUTRIN XL) 150 mg tablet increased  Restart therapy  Diet and exercise discussed   3. Type 2 diabetes mellitus with diabetic neuropathy, with long-term current use of insulin (HCC) E11.40 250.60 ROI signed last visit, will f/u  F/u with endo as scheduled    Z79.4 357.2      V58.67        Requested Prescriptions     Signed Prescriptions Disp Refills   ??? cephALEXin (KEFLEX) 500 mg capsule 28 Cap 0     Sig: Take 1 Cap by mouth four (4) times daily for 7 days.   ??? buPROPion XL (WELLBUTRIN XL) 150 mg tablet 30 Tab 2     Sig: Take 1 Tab by mouth every morning for 90 days.     Patient verbalized understanding and agreement with the plan.    Patient was given an after-visit summary.    Follow-up Disposition:  Return in about 8 weeks (around 04/24/2017), or if symptoms worsen or fail to improve, for depression. or sooner if worsening symptoms.    More than 50% of this 25 min visit was spent counseling the patient face to face about etiology and treatment of health conditions outlined in assessment and plan.        Adrian SaranAleea Maye M.D.  [facility]  [address] [place]  [place], [place] [id number]  Ph - [phone]  Fax - [phone]

## 2017-04-24 ENCOUNTER — Encounter: Attending: Internal Medicine | Primary: Internal Medicine

## 2017-06-21 NOTE — Telephone Encounter (Signed)
Lvm for patient to return call and schedule appt. Pt will need to schedule Medicare Wellness Annual Visit.

## 2020-10-11 ENCOUNTER — Ambulatory Visit: Payer: Self-pay

## 2020-12-04 ENCOUNTER — Other Ambulatory Visit: Payer: Self-pay

## 2020-12-04 ENCOUNTER — Emergency Department (INDEPENDENT_AMBULATORY_CARE_PROVIDER_SITE_OTHER)
Admission: EM | Admit: 2020-12-04 | Discharge: 2020-12-04 | Disposition: A | Discharge status: Home / Self Care | Payer: BC Managed Care – PPO | Source: Home / Self Care

## 2020-12-04 ENCOUNTER — Emergency Department: Admit: 2020-12-04 | Discharge status: Against Medical Advice | Payer: Self-pay

## 2020-12-04 DIAGNOSIS — J988 Other specified respiratory disorders: Secondary | ICD-10-CM

## 2020-12-04 DIAGNOSIS — B9789 Other viral agents as the cause of diseases classified elsewhere: Secondary | ICD-10-CM

## 2020-12-04 DIAGNOSIS — R509 Fever, unspecified: Secondary | ICD-10-CM

## 2020-12-04 NOTE — ED Triage Notes (Signed)
Pt presents to Urgent Care with c/o fever, body aches, and cough x 4 days. Pt w/ known COVID exposure; has had one Pfizer vaccine.

## 2020-12-04 NOTE — ED Notes (Signed)
Pt decided to leave without being seeing by Provider. Jerrilyn Cairo NP advised and was okay with me sending out the test for covid even though pt had mild symptoms.

## 2020-12-07 ENCOUNTER — Telehealth: Payer: Self-pay

## 2020-12-07 LAB — COVID-19, FLU A+B NAA
Influenza A, NAA: NOT DETECTED
Influenza B, NAA: NOT DETECTED
SARS-CoV-2, NAA: DETECTED — AB

## 2020-12-07 NOTE — Telephone Encounter (Signed)
Pt left VM on nurse line requested referral to [facility]. Note sent to Jerrilyn Cairo for evaluation.
